# Patient Record
Sex: Female | Born: 1975 | Race: Black or African American | Hispanic: No | Marital: Single | Smoking: Never smoker
Health system: Southern US, Community
[De-identification: ages and names within clinical notes are randomized; demographics above are authoritative.]

## PROBLEM LIST (undated history)

## (undated) HISTORY — PX: MOUTH SURGERY: SHX715

---

## 1999-01-07 ENCOUNTER — Emergency Department (HOSPITAL_COMMUNITY): Admission: EM | Admit: 1999-01-07 | Discharge: 1999-01-07 | Payer: Self-pay | Admitting: Emergency Medicine

## 1999-01-08 ENCOUNTER — Encounter: Payer: Self-pay | Admitting: Emergency Medicine

## 2007-06-19 ENCOUNTER — Emergency Department (HOSPITAL_COMMUNITY): Admission: EM | Admit: 2007-06-19 | Discharge: 2007-06-19 | Payer: Self-pay | Admitting: Emergency Medicine

## 2008-11-08 IMAGING — CR DG WRIST 2V*L*
2 series · 2 of 2 positions shown · non-contrast
Comparison: none

CLINICAL DATA: Dog bite.  
 LEFT WRIST ? 2 VIEWS:

[x wrist pa left]
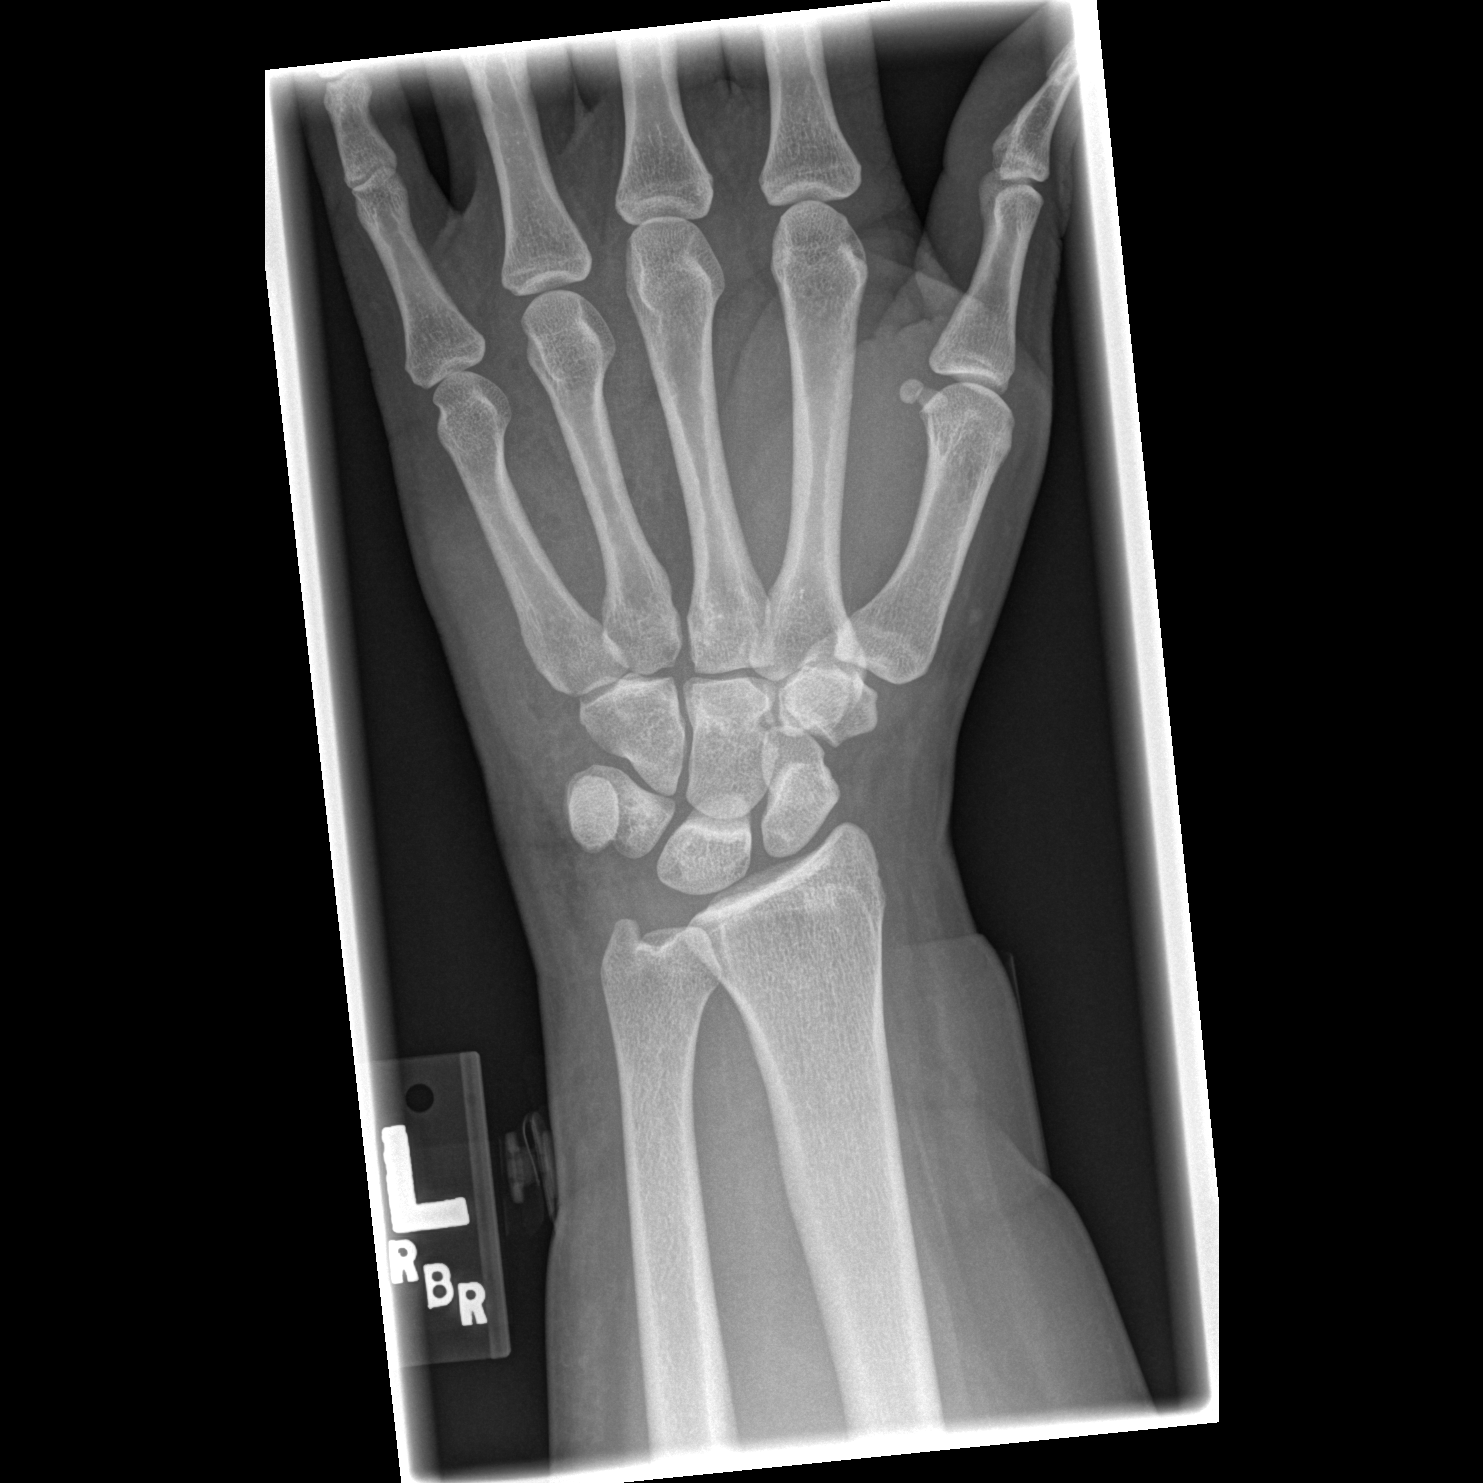

[x wrist lat left]
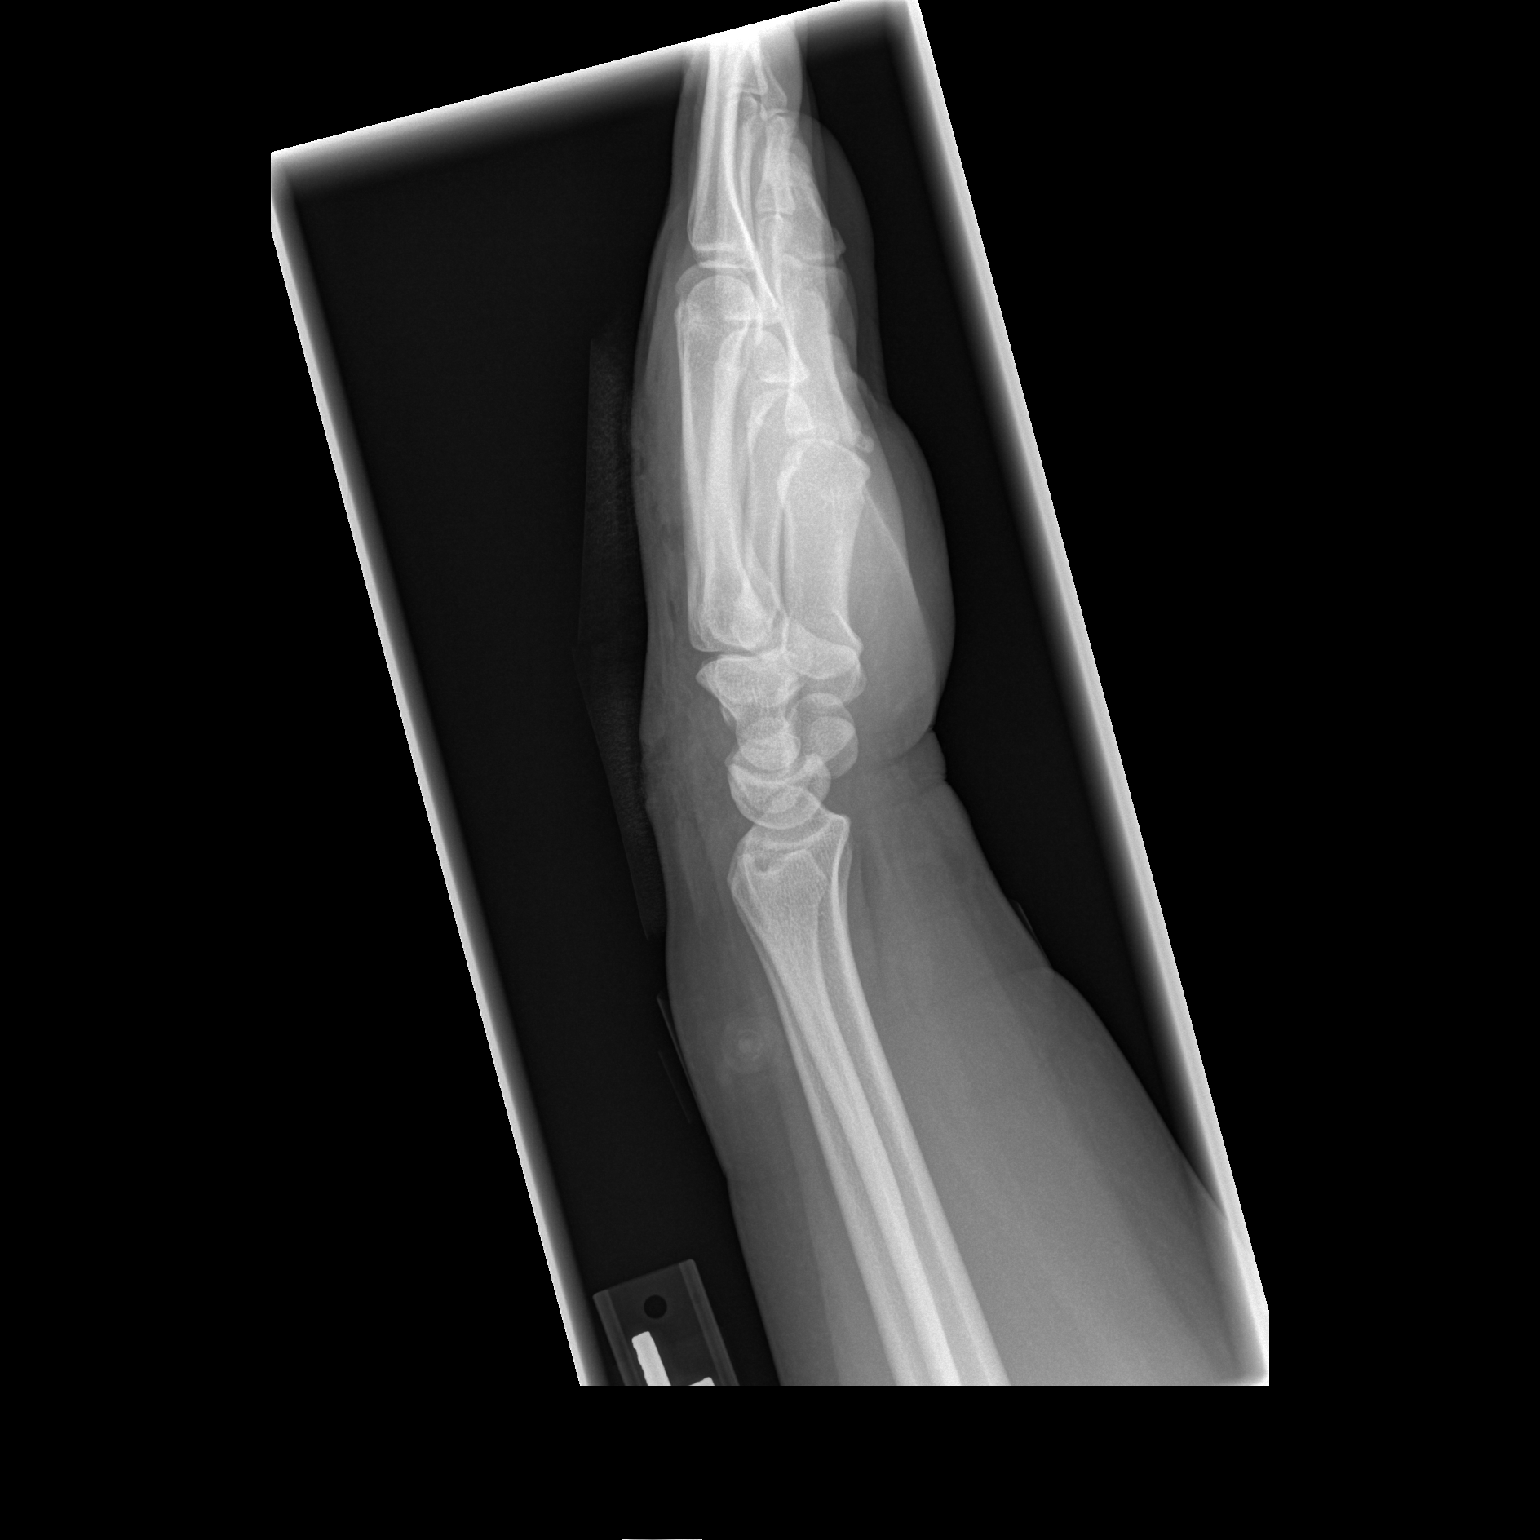

[2 of 2 positions shown; findings below may reference images not displayed]

FINDINGS: No osseous abnormality evident.  No radiodense foreign bodies.  There is evidence for subcutaneous gas along the dorsum of the hand as well as several soft tissue defects.
IMPRESSION: 1.  No osseous abnormality.
 2.  No radiodense foreign body.
 3.  Small amount of subcutaneous gas and several dorsal soft tissue wounds along the dorsal aspect of the hand.

## 2008-11-08 IMAGING — CR DG KNEE 1-2V*L*
2 series · 2 of 2 positions shown · non-contrast
Comparison: none

CLINICAL DATA: Dog bite.
 LEFT KNEE ? 2 VIEWS:

[t knee ap left]
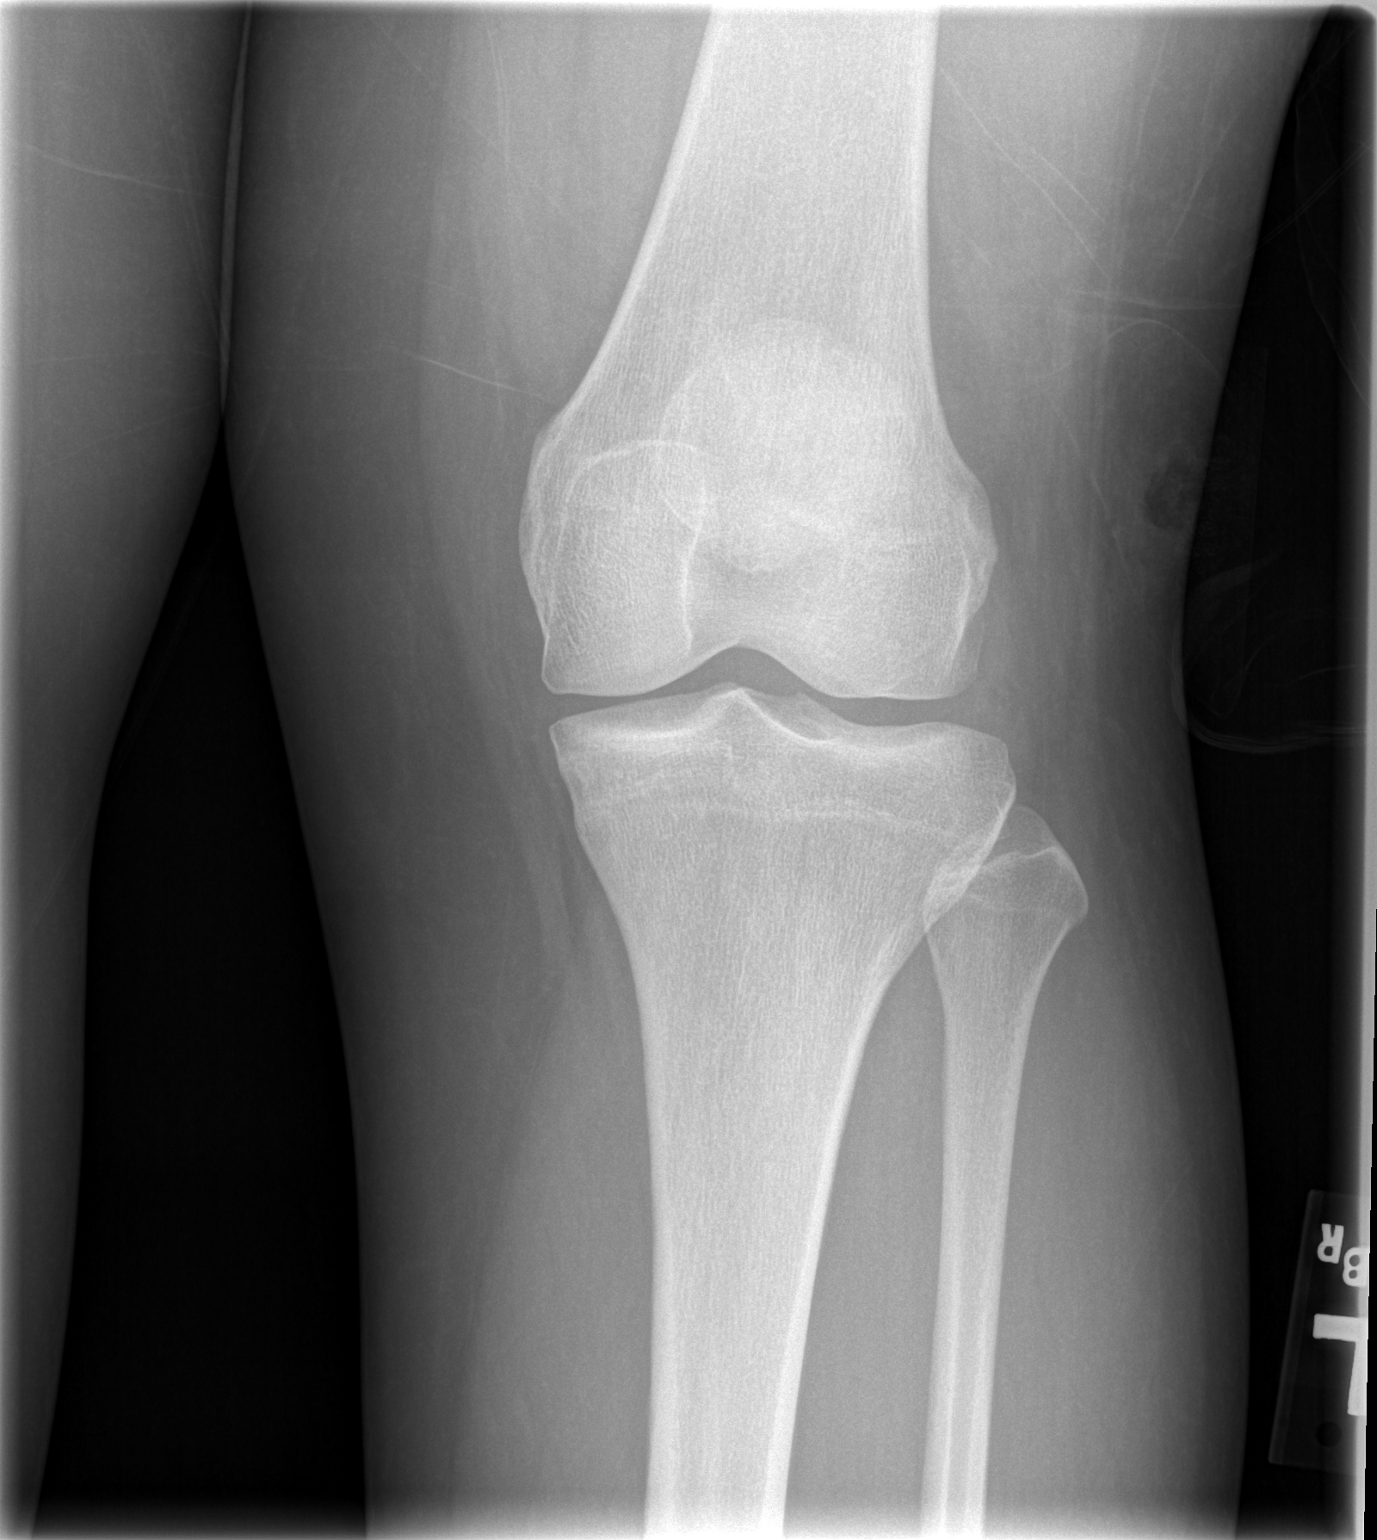

[t knee lat left]
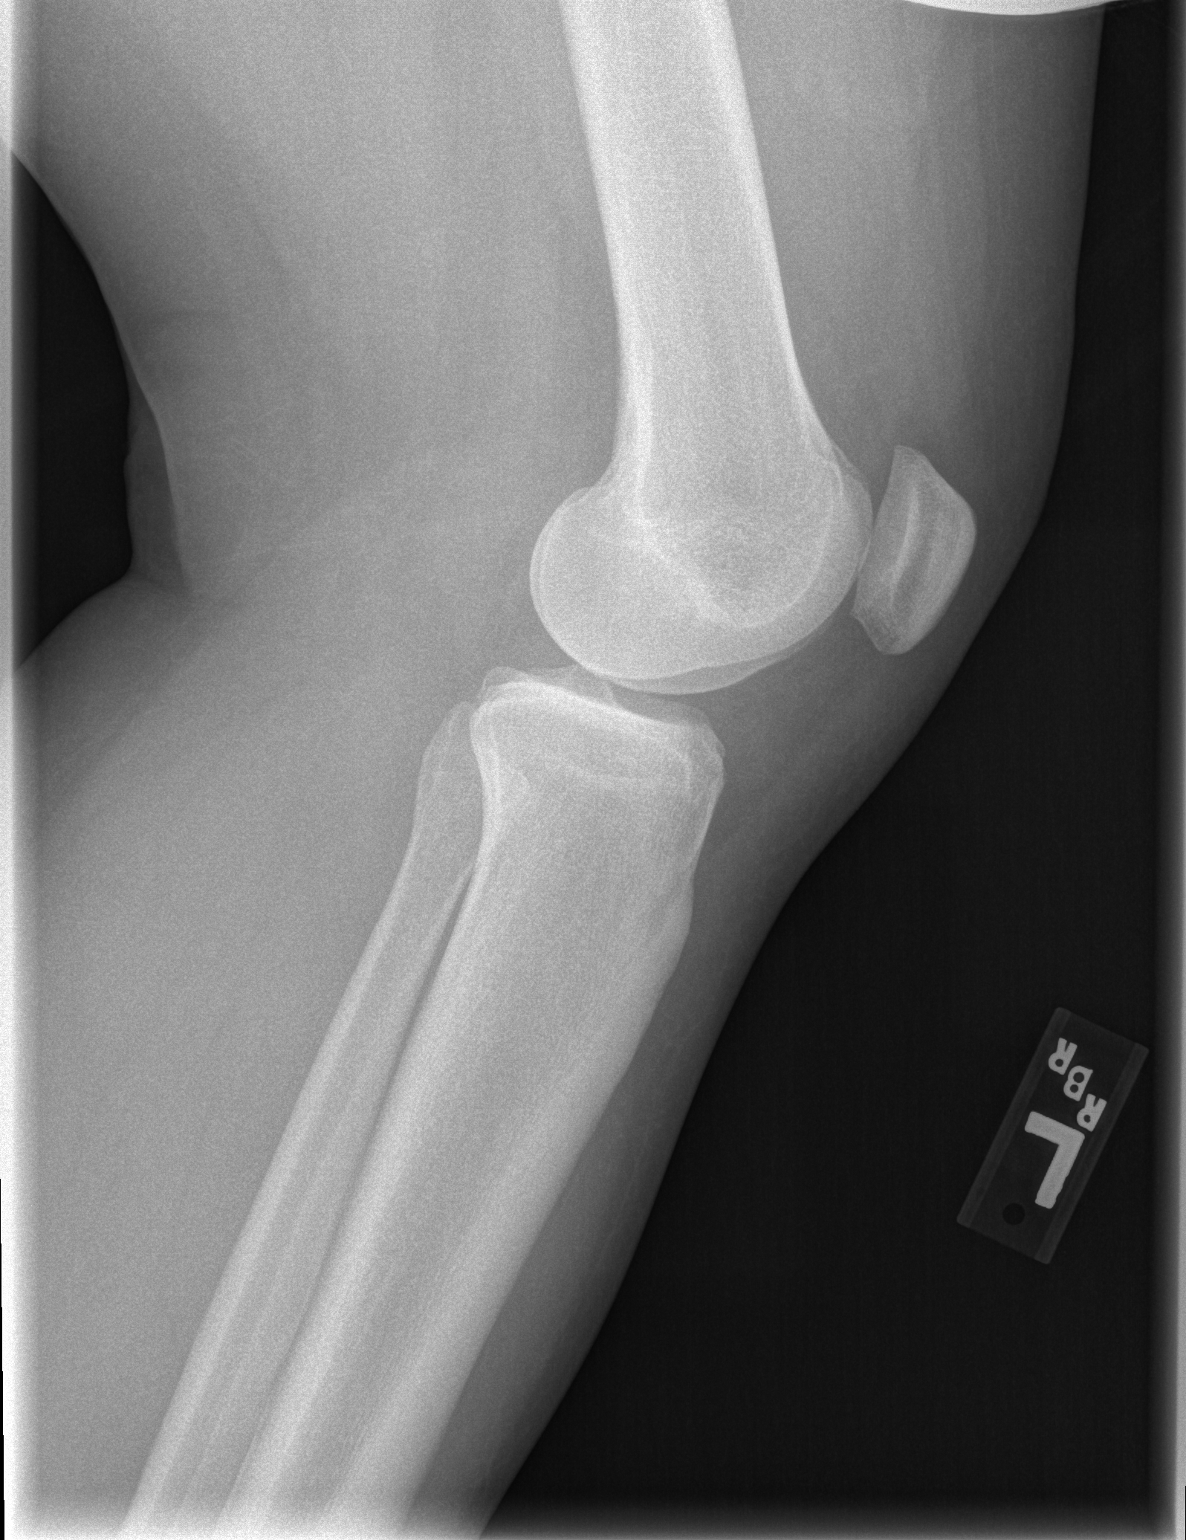

[2 of 2 positions shown; findings below may reference images not displayed]

FINDINGS: No bony abnormality.  No radiodense foreign body.  Soft tissue defect along the lateral aspect of the left knee just above the joint line.
IMPRESSION: 1.  No evidence of bony abnormality.
 2.  No radiodense foreign body.
 3.  Soft tissue defect superior and lateral to the left knee joint.

## 2012-11-11 ENCOUNTER — Encounter (HOSPITAL_COMMUNITY): Payer: Self-pay | Admitting: Emergency Medicine

## 2012-11-11 ENCOUNTER — Emergency Department (HOSPITAL_COMMUNITY)
Admission: EM | Admit: 2012-11-11 | Discharge: 2012-11-11 | Discharge status: Against Medical Advice | Payer: No Typology Code available for payment source | Attending: Emergency Medicine | Admitting: Emergency Medicine

## 2012-11-11 DIAGNOSIS — Y92410 Unspecified street and highway as the place of occurrence of the external cause: Secondary | ICD-10-CM | POA: Insufficient documentation

## 2012-11-11 DIAGNOSIS — S8990XA Unspecified injury of unspecified lower leg, initial encounter: Secondary | ICD-10-CM | POA: Insufficient documentation

## 2012-11-11 DIAGNOSIS — S298XXA Other specified injuries of thorax, initial encounter: Secondary | ICD-10-CM | POA: Insufficient documentation

## 2012-11-11 DIAGNOSIS — Y9389 Activity, other specified: Secondary | ICD-10-CM | POA: Insufficient documentation
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
## 2012-11-11 NOTE — ED Notes (Signed)
Pt to triage window stating she needs to leave; states will return if condition worsens

## 2012-11-12 ENCOUNTER — Encounter (HOSPITAL_COMMUNITY): Payer: Self-pay | Admitting: Emergency Medicine

## 2012-11-12 ENCOUNTER — Emergency Department (HOSPITAL_COMMUNITY)
Admission: EM | Admit: 2012-11-12 | Discharge: 2012-11-12 | Disposition: A | Discharge status: Home / Self Care | Payer: No Typology Code available for payment source | Attending: Emergency Medicine | Admitting: Emergency Medicine

## 2012-11-12 DIAGNOSIS — Z79899 Other long term (current) drug therapy: Secondary | ICD-10-CM | POA: Insufficient documentation

## 2012-11-12 DIAGNOSIS — S8001XA Contusion of right knee, initial encounter: Secondary | ICD-10-CM

## 2012-11-12 DIAGNOSIS — Y92410 Unspecified street and highway as the place of occurrence of the external cause: Secondary | ICD-10-CM | POA: Insufficient documentation

## 2012-11-12 DIAGNOSIS — IMO0002 Reserved for concepts with insufficient information to code with codable children: Secondary | ICD-10-CM | POA: Insufficient documentation

## 2012-11-12 DIAGNOSIS — S20219A Contusion of unspecified front wall of thorax, initial encounter: Secondary | ICD-10-CM | POA: Insufficient documentation

## 2012-11-12 DIAGNOSIS — S8000XA Contusion of unspecified knee, initial encounter: Secondary | ICD-10-CM | POA: Insufficient documentation

## 2012-11-12 DIAGNOSIS — Y9389 Activity, other specified: Secondary | ICD-10-CM | POA: Insufficient documentation

## 2012-11-12 NOTE — ED Provider Notes (Signed)
Medical screening examination/treatment/procedure(s) were performed by non-physician practitioner and as supervising physician I was immediately available for consultation/collaboration.   Kaitlin Hernandez. Oletta Lamas, MD 11/12/12 [phone]
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
## 2017-11-08 ENCOUNTER — Ambulatory Visit (HOSPITAL_COMMUNITY)
Admission: EM | Admit: 2017-11-08 | Discharge: 2017-11-08 | Disposition: A | Discharge status: Home / Self Care | Payer: BC Managed Care – PPO | Attending: Physician Assistant | Admitting: Physician Assistant

## 2017-11-08 ENCOUNTER — Encounter (HOSPITAL_COMMUNITY): Payer: Self-pay | Admitting: Emergency Medicine

## 2017-11-08 DIAGNOSIS — L02411 Cutaneous abscess of right axilla: Secondary | ICD-10-CM

## 2017-11-08 MED ORDER — DOXYCYCLINE HYCLATE 100 MG PO CAPS: 100.0000 mg | ORAL_CAPSULE | Freq: Two times a day (BID) | ORAL | 0 refills | Status: AC

## 2017-11-08 NOTE — ED Provider Notes (Signed)
11/08/2017 1:47 PM   DOB: 09/16/1975 / MRN: [id number]  SUBJECTIVE:  Kaitlin Hernandez is a 42 y.o. female presenting for right-sided axillary pain.  Has a history of ingrown hairs in the did have a near and wax job done about the bilateral axillas about 2 weeks ago.  Noticed a bump 3 days ago about the right axilla and this is progressed to the size of a golf ball.  No history of abscess in the area.  Denies fever, chills, nausea.  She has No Known Allergies.   She  has no past medical history on file.    She  reports that  has never smoked. She does not have any smokeless tobacco history on file. She reports that she drinks alcohol. She reports that she does not use drugs. She  has no sexual activity history on file. The patient  has a past surgical history that includes Mouth surgery.  Her family history includes Cancer in her father; Diabetes in her other; Hypertension in her other.  ROS: Per HPI  OBJECTIVE:  BP (!) 149/82 (BP Location: Left Arm) Comment: Notified TIna  Pulse 83   Temp 98.1 F (36.7 C) (Oral)   Resp 16   LMP 10/22/2017   SpO2 100%   Physical Exam: Vitals reviewed.  Patient in no acute distress.  Golf ball sized fluctuant mass about the right axilla.  Mildly tender.  There is erythema surrounding the area.:  No results found for this or any previous visit (from the past 72 hour(s)).  Procedure: Risk and benefits discussed and verbal consent obtained.  Patient anesthetized topically with cold spray.  Wound lanced with an 11 blade with roughly a 1 cm incision.  Copious purulent drainage. No results found.  ASSESSMENT AND PLAN:  No orders of the defined types were placed in this encounter.    Abscess of right axilla: Drained here without difficulty.  Starting doxycycline as this is most likely a staph infection of some sort.  No culture sent.      The patient is advised to call or return to clinic if she does not see an improvement in symptoms, or to  seek the care of the closest emergency department if she worsens with the above plan.   Deliah BostonMichael Kalonji Zurawski, MHS, PA-C 11/08/2017 1:47 PM   Kaitlin Hernandez, Kaitlin Grandberry L, PA-C 11/08/17 1349

## 2017-11-08 NOTE — Discharge Instructions (Signed)
Start the Doxy cycling.  Continue doing lots of warm compress.  I expect lots of drainage over the next 24 hours.

## 2017-11-08 NOTE — ED Triage Notes (Signed)
Pt c/o abscess under R armpit x1 week.

## 2019-10-22 ENCOUNTER — Ambulatory Visit: Payer: BC Managed Care – PPO | Attending: Internal Medicine

## 2019-10-22 DIAGNOSIS — Z23 Encounter for immunization: Secondary | ICD-10-CM | POA: Insufficient documentation

## 2019-10-22 NOTE — Progress Notes (Signed)
   Covid-19 Vaccination Clinic  Name:  Kaitlin Hernandez    MRN: [id number] DOB: November 05, 1975  10/22/2019  Kaitlin Hernandez was observed post Covid-19 immunization for 15 minutes without incidence. She was provided with Vaccine Information Sheet and instruction to access the V-Safe system.   Kaitlin Hernandez was instructed to call 911 with any severe reactions post vaccine: Marland Kitchen Difficulty breathing  . Swelling of your face and throat  . A fast heartbeat  . A bad rash all over your body  . Dizziness and weakness    Immunizations Administered    Name Date Dose VIS Date Route   Pfizer COVID-19 Vaccine 10/22/2019  6:10 PM 0.3 mL 08/05/2019 Intramuscular   Manufacturer: [employer], [employer]   Lot: [id number]   NDC: [address]-6

## 2019-11-12 ENCOUNTER — Ambulatory Visit: Payer: BC Managed Care – PPO | Attending: Internal Medicine

## 2019-11-12 DIAGNOSIS — Z23 Encounter for immunization: Secondary | ICD-10-CM

## 2019-11-12 NOTE — Progress Notes (Signed)
   Covid-19 Vaccination Clinic  Name:  Kaitlin Hernandez    MRN: [id number] DOB: 15-Aug-1976  11/12/2019  [name] was observed post Covid-19 immunization for 15 minutes without incident. She was provided with Vaccine Information Sheet and instruction to access the V-Safe system.   [name] was instructed to call 911 with any severe reactions post vaccine: Marland Kitchen Difficulty breathing  . Swelling of face and throat  . A fast heartbeat  . A bad rash all over body  . Dizziness and weakness   Immunizations Administered    Name Date Dose VIS Date Route   Pfizer COVID-19 Vaccine 11/12/2019  2:38 PM 0.3 mL 08/05/2019 Intramuscular   Manufacturer: [employer], [employer]   Lot: [id number]   NDC: [address]-4
